# Patient Record
Sex: Female | Born: 1985 | Race: White | Hispanic: No | Marital: Single | Smoking: Current every day smoker
Health system: Southern US, Community
[De-identification: ages and names within clinical notes are randomized; demographics above are authoritative.]

## PROBLEM LIST (undated history)

## (undated) DIAGNOSIS — F32A Depression, unspecified: Secondary | ICD-10-CM

## (undated) DIAGNOSIS — F419 Anxiety disorder, unspecified: Secondary | ICD-10-CM

## (undated) DIAGNOSIS — F329 Major depressive disorder, single episode, unspecified: Secondary | ICD-10-CM

## (undated) HISTORY — DX: Anxiety disorder, unspecified: F41.9

## (undated) HISTORY — DX: Depression, unspecified: F32.A

## (undated) HISTORY — DX: Major depressive disorder, single episode, unspecified: F32.9

---

## 2006-01-02 ENCOUNTER — Emergency Department: Payer: Self-pay | Admitting: General Practice

## 2006-01-08 ENCOUNTER — Ambulatory Visit: Payer: Self-pay | Admitting: Orthopaedic Surgery

## 2007-10-17 ENCOUNTER — Ambulatory Visit: Payer: Self-pay | Admitting: Family Medicine

## 2016-02-02 ENCOUNTER — Encounter: Payer: Self-pay | Admitting: Family Medicine

## 2016-02-02 ENCOUNTER — Ambulatory Visit (INDEPENDENT_AMBULATORY_CARE_PROVIDER_SITE_OTHER): Payer: BLUE CROSS/BLUE SHIELD | Admitting: Family Medicine

## 2016-02-02 VITALS — BP 130/80 | HR 80 | Ht 64.0 in | Wt 190.0 lb

## 2016-02-02 DIAGNOSIS — L309 Dermatitis, unspecified: Secondary | ICD-10-CM | POA: Diagnosis not present

## 2016-02-02 DIAGNOSIS — Z7689 Persons encountering health services in other specified circumstances: Secondary | ICD-10-CM

## 2016-02-02 DIAGNOSIS — A6929 Other conditions associated with Lyme disease: Secondary | ICD-10-CM

## 2016-02-02 DIAGNOSIS — I5189 Other ill-defined heart diseases: Secondary | ICD-10-CM

## 2016-02-02 DIAGNOSIS — Z7189 Other specified counseling: Secondary | ICD-10-CM

## 2016-02-02 DIAGNOSIS — R569 Unspecified convulsions: Secondary | ICD-10-CM | POA: Diagnosis not present

## 2016-02-02 DIAGNOSIS — G40209 Localization-related (focal) (partial) symptomatic epilepsy and epileptic syndromes with complex partial seizures, not intractable, without status epilepticus: Secondary | ICD-10-CM

## 2016-02-02 DIAGNOSIS — G40109 Localization-related (focal) (partial) symptomatic epilepsy and epileptic syndromes with simple partial seizures, not intractable, without status epilepticus: Secondary | ICD-10-CM

## 2016-02-02 MED ORDER — TRIAMCINOLONE ACETONIDE 0.1 % EX CREA: 1.0000 "application " | TOPICAL_CREAM | Freq: Two times a day (BID) | CUTANEOUS | 0 refills | Status: DC

## 2016-02-02 NOTE — Progress Notes (Signed)
Name: Charlene Marquez   MRN: [id number]    DOB: June 27, 1986   Date:02/02/2016       Progress Note  Subjective  Chief Complaint  Chief Complaint  Patient presents with  . Establish Care  . Seizures    needs referral to [facility] for seizure disorder- neurologist left, so needs a new referral                  Hx of third degree block/ lyme carditis/    Seizures   This is a chronic problem. The current episode started more than 1 week ago (2 weeks ago). There were 2 to 3 seizures. The most recent episode lasted more than 5 minutes. Pertinent negatives include no headaches, no sore throat, no chest pain, no cough, no nausea and no diarrhea. olfactory aura/ repitious word/ LOC The episode was not witnessed. There was the sensation of an aura present. There has been no fever.  Rash  This is a new problem. The current episode started more than 1 month ago. The problem has been waxing and waning since onset. The affected locations include the left hand and right hand. The rash is characterized by blistering, dryness and itchiness. Associated with: multiple meds. Pertinent negatives include no cough, diarrhea, fever, joint pain, shortness of breath or sore throat. Past treatments include nothing.     No problem-specific Assessment & Plan notes found for this encounter.   No past medical history on file.  No past surgical history on file.  No family history on file.  Social History   Social History  . Marital status: Single    Spouse name: N/A  . Number of children: N/A  . Years of education: N/A   Occupational History  . Not on file.   Social History Main Topics  . Smoking status: Current Every Day Smoker    Types: Cigarettes  . Smokeless tobacco: Never Used  . Alcohol use Yes  . Drug use: No  . Sexual activity: Yes   Other Topics Concern  . Not on file   Social History Narrative  . No narrative on file    Allergies  Allergen Reactions  . Rocephin  [Ceftriaxone]      Review of Systems  Unable to perform ROS: Other (amenorrhea)  Constitutional: Negative for chills, fever, malaise/fatigue and weight loss.  HENT: Negative for ear discharge, ear pain and sore throat.   Eyes: Negative for blurred vision.  Respiratory: Negative for cough, sputum production, shortness of breath and wheezing.   Cardiovascular: Negative for chest pain, palpitations and leg swelling.  Gastrointestinal: Negative for abdominal pain, blood in stool, constipation, diarrhea, heartburn, melena and nausea.  Genitourinary: Negative for dysuria, frequency, hematuria and urgency.  Musculoskeletal: Negative for back pain, joint pain, myalgias and neck pain.  Skin: Positive for rash.  Neurological: Positive for seizures. Negative for dizziness, tingling, sensory change, focal weakness and headaches.  Endo/Heme/Allergies: Negative for environmental allergies and polydipsia. Does not bruise/bleed easily.  Psychiatric/Behavioral: Positive for depression. Negative for suicidal ideas. The patient is nervous/anxious.      Objective  Vitals:   02/02/16 1048  BP: 130/80  Pulse: 80  Weight: 190 lb (86.2 kg)  Height: 5\' 4"  (1.626 m)    Physical Exam  Constitutional: She is well-developed, well-nourished, and in no distress. No distress.  HENT:  Head: Normocephalic and atraumatic.  Right Ear: External ear normal.  Left Ear: External ear normal.  Nose: Nose normal.  Mouth/Throat: Oropharynx is  clear and moist.  Eyes: Conjunctivae and EOM are normal. Pupils are equal, round, and reactive to light. Right eye exhibits no discharge. Left eye exhibits no discharge.  Neck: Normal range of motion. Neck supple. No JVD present. No thyromegaly present.  Cardiovascular: Normal rate, regular rhythm, normal heart sounds and intact distal pulses.  Exam reveals no gallop and no friction rub.   No murmur heard. Pulmonary/Chest: Effort normal and breath sounds normal.  Abdominal:  Soft. Bowel sounds are normal. She exhibits no mass. There is no tenderness. There is no guarding.  Musculoskeletal: Normal range of motion. She exhibits no edema.  Lymphadenopathy:    She has no cervical adenopathy.  Neurological: She is alert. She has normal reflexes.  Skin: Skin is warm and dry. She is not diaphoretic.  Psychiatric: Mood and affect normal.  Nursing note and vitals reviewed.     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 Elizabeth Sauer [facility]  02/02/16

## 2017-09-29 ENCOUNTER — Other Ambulatory Visit
Admission: RE | Admit: 2017-09-29 | Discharge: 2017-09-29 | Disposition: A | Discharge status: Home / Self Care | Payer: BLUE CROSS/BLUE SHIELD | Source: Ambulatory Visit | Attending: Family Medicine | Admitting: Family Medicine

## 2017-09-29 ENCOUNTER — Encounter: Payer: Self-pay | Admitting: Family Medicine

## 2017-09-29 ENCOUNTER — Ambulatory Visit: Payer: BLUE CROSS/BLUE SHIELD | Admitting: Family Medicine

## 2017-09-29 ENCOUNTER — Ambulatory Visit
Admission: RE | Admit: 2017-09-29 | Discharge: 2017-09-29 | Disposition: A | Discharge status: Home / Self Care | Payer: BLUE CROSS/BLUE SHIELD | Source: Ambulatory Visit | Attending: Family Medicine | Admitting: Family Medicine

## 2017-09-29 VITALS — BP 120/100 | HR 100 | Temp 98.0°F | Resp 14 | Ht 64.0 in | Wt 211.0 lb

## 2017-09-29 DIAGNOSIS — J0100 Acute maxillary sinusitis, unspecified: Secondary | ICD-10-CM | POA: Diagnosis not present

## 2017-09-29 DIAGNOSIS — J181 Lobar pneumonia, unspecified organism: Secondary | ICD-10-CM

## 2017-09-29 DIAGNOSIS — R509 Fever, unspecified: Secondary | ICD-10-CM

## 2017-09-29 DIAGNOSIS — Z8701 Personal history of pneumonia (recurrent): Secondary | ICD-10-CM | POA: Insufficient documentation

## 2017-09-29 DIAGNOSIS — R0989 Other specified symptoms and signs involving the circulatory and respiratory systems: Secondary | ICD-10-CM | POA: Diagnosis not present

## 2017-09-29 DIAGNOSIS — R05 Cough: Secondary | ICD-10-CM | POA: Diagnosis present

## 2017-09-29 DIAGNOSIS — J189 Pneumonia, unspecified organism: Secondary | ICD-10-CM

## 2017-09-29 LAB — CBC WITH DIFFERENTIAL/PLATELET
BASOS PCT: 1 %
Basophils Absolute: 0.1 10*3/uL (ref 0–0.1)
EOS ABS: 0.6 10*3/uL (ref 0–0.7)
Eosinophils Relative: 7 %
HCT: 45 % (ref 35.0–47.0)
HEMOGLOBIN: 15.4 g/dL (ref 12.0–16.0)
Lymphocytes Relative: 39 %
Lymphs Abs: 3.7 10*3/uL — ABNORMAL HIGH (ref 1.0–3.6)
MCH: 29.6 pg (ref 26.0–34.0)
MCHC: 34.1 g/dL (ref 32.0–36.0)
MCV: 86.6 fL (ref 80.0–100.0)
MONOS PCT: 8 %
Monocytes Absolute: 0.7 10*3/uL (ref 0.2–0.9)
NEUTROS PCT: 45 %
Neutro Abs: 4.4 10*3/uL (ref 1.4–6.5)
Platelets: 294 10*3/uL (ref 150–440)
RBC: 5.19 MIL/uL (ref 3.80–5.20)
RDW: 12.7 % (ref 11.5–14.5)
WBC: 9.6 10*3/uL (ref 3.6–11.0)

## 2017-09-29 LAB — POCT INFLUENZA A/B
INFLUENZA A, POC: NEGATIVE
INFLUENZA B, POC: NEGATIVE

## 2017-09-29 MED ORDER — AZITHROMYCIN 250 MG PO TABS
ORAL_TABLET | ORAL | 0 refills | Status: DC
Start: 2017-09-29 — End: 2017-10-07

## 2017-09-29 NOTE — Addendum Note (Signed)
Addended by: Elizabeth SauerJONES, Cinda Hara C on: 09/29/2017 05:00 PM   Modules accepted: Level of Service

## 2017-09-29 NOTE — Progress Notes (Signed)
Name: Charlene Marquez   MRN: [id number]    DOB: 08-Feb-1986   Date:09/29/2017       Progress Note  Subjective  Chief Complaint  Chief Complaint  Patient presents with  . Fever    taking tylenol cold and flu for fever and chills, cong, alternating with dayquil- started 4 days ago    Fever   This is a new problem. The current episode started in the past 7 days (Thursday). The problem occurs constantly. The problem has been gradually worsening. The maximum temperature noted was 100 to 100.9 F. The temperature was taken using an oral thermometer. Associated symptoms include congestion, coughing, headaches, nausea, sleepiness, a sore throat, vomiting and wheezing. Pertinent negatives include no abdominal pain, chest pain, diarrhea, ear pain, muscle aches, rash or urinary pain. She has tried acetaminophen for the symptoms. The treatment provided no relief.  Risk factors: immunosuppression   Risk factors: no sick contacts   Risk factors comment:  Lyme   No problem-specific Assessment & Plan notes found for this encounter.   Past Medical History:  Diagnosis Date  . Anxiety   . Depression     History reviewed. No pertinent surgical history.  History reviewed. No pertinent family history.  Social History   Socioeconomic History  . Marital status: Single    Spouse name: Not on file  . Number of children: Not on file  . Years of education: Not on file  . Highest education level: Not on file  Occupational History  . Not on file  Social Needs  . Financial resource strain: Not on file  . Food insecurity:    Worry: Not on file    Inability: Not on file  . Transportation needs:    Medical: Not on file    Non-medical: Not on file  Tobacco Use  . Smoking status: Current Every Day Smoker    Types: Cigarettes  . Smokeless tobacco: Never Used  Substance and Sexual Activity  . Alcohol use: Yes  . Drug use: No  . Sexual activity: Yes  Lifestyle  . Physical activity:    Days per week: Not  on file    Minutes per session: Not on file  . Stress: Not on file  Relationships  . Social connections:    Talks on phone: Not on file    Gets together: Not on file    Attends religious service: Not on file    Active member of club or organization: Not on file    Attends meetings of clubs or organizations: Not on file    Relationship status: Not on file  . Intimate partner violence:    Fear of current or ex partner: Not on file    Emotionally abused: Not on file    Physically abused: Not on file    Forced sexual activity: Not on file  Other Topics Concern  . Not on file  Social History Narrative  . Not on file    Allergies  Allergen Reactions  . Rocephin [Ceftriaxone]     Outpatient Medications Prior to Visit  Medication Sig Dispense Refill  . amphetamine-dextroamphetamine (ADDERALL) 20 MG tablet Take 1 tablet by mouth 2 (two) times daily. psych  0  . amphetamine-dextroamphetamine (ADDERALL) 30 MG tablet Take 30 mg by mouth daily. Dr Lloyd Huger (psych)     . ARIPiprazole (ABILIFY) 10 MG tablet Take 10 mg by mouth daily. psych    . clonazePAM (KLONOPIN) 1 MG tablet Take 1 mg by mouth 2 (  two) times daily. Dr Lloyd Huger (psych)    . lamoTRIgine (LAMICTAL) 200 MG tablet Take 200 mg by mouth daily. 1 and 1/2 pills per day= 300mg  daily- Dr Lloyd Huger (psych)    . prazosin (MINIPRESS) 5 MG capsule Take 5 mg by mouth at bedtime. Dr Lloyd Huger (psych)    . triamcinolone cream (KENALOG) 0.1 % Apply 1 application topically 2 (two) times daily. 60 g 0  . lithium 300 MG tablet Take 900 mg by mouth daily. Dr Lloyd Huger (psych)     No facility-administered medications prior to visit.     Review of Systems  Constitutional: Positive for fever. Negative for chills, malaise/fatigue and weight loss.  HENT: Positive for congestion and sore throat. Negative for ear discharge and ear pain.   Eyes: Negative for blurred vision.  Respiratory: Positive for cough and wheezing. Negative for sputum production and shortness of  breath.   Cardiovascular: Negative for chest pain, palpitations and leg swelling.  Gastrointestinal: Positive for nausea and vomiting. Negative for abdominal pain, blood in stool, constipation, diarrhea, heartburn and melena.  Genitourinary: Negative for dysuria, frequency, hematuria and urgency.  Musculoskeletal: Negative for back pain, joint pain, myalgias and neck pain.  Skin: Negative for rash.  Neurological: Positive for headaches. Negative for dizziness, tingling, sensory change and focal weakness.  Endo/Heme/Allergies: Negative for environmental allergies and polydipsia. Does not bruise/bleed easily.  Psychiatric/Behavioral: Negative for depression and suicidal ideas. The patient is not nervous/anxious and does not have insomnia.      Objective  Vitals:   09/29/17 0855  BP: (!) 120/100  Pulse: 100  Resp: 14  Temp: 98 F (36.7 C)  TempSrc: Oral  SpO2: 97%  Weight: 211 lb (95.7 kg)  Height: 5\' 4"  (1.626 m)    Physical Exam  Constitutional: She is well-developed, well-nourished, and in no distress. No distress.  HENT:  Head: Normocephalic and atraumatic.  Right Ear: Tympanic membrane, external ear and ear canal normal.  Left Ear: Tympanic membrane, external ear and ear canal normal.  Nose: Mucosal edema present. Right sinus exhibits maxillary sinus tenderness. Right sinus exhibits no frontal sinus tenderness. Left sinus exhibits maxillary sinus tenderness. Left sinus exhibits no frontal sinus tenderness.  Mouth/Throat: Uvula is midline, oropharynx is clear and moist and mucous membranes are normal.  Eyes: Pupils are equal, round, and reactive to light. Conjunctivae and EOM are normal. Right eye exhibits no discharge. Left eye exhibits no discharge.  Neck: Normal range of motion. Neck supple. No JVD present. No thyromegaly present.  Cardiovascular: Normal rate, regular rhythm, normal heart sounds and intact distal pulses. Exam reveals no gallop and no friction rub.  No murmur  heard. Pulmonary/Chest: Effort normal. She has decreased breath sounds in the right middle field. She has no wheezes. She has no rales. Chest wall is dull to percussion.  Abdominal: Soft. Bowel sounds are normal. She exhibits no mass. There is no tenderness. There is no rebound and no guarding.  Musculoskeletal: Normal range of motion. She exhibits no edema.  Lymphadenopathy:    She has no cervical adenopathy.  Neurological: She is alert. She has normal reflexes.  Skin: Skin is warm and dry. She is not diaphoretic.  Psychiatric: Mood and affect normal.  Nursing note and vitals reviewed.     Assessment & Plan  Problem List Items Addressed This Visit    None    Visit Diagnoses    Fever and chills    -  Primary   Relevant Orders   POCT Influenza  A/B (Completed)   DG Chest 2 View (Completed)   Pneumonia of right middle lobe due to infectious organism [facility](HCC)       cbc/downstairs lab   Relevant Medications   azithromycin (ZITHROMAX) 250 MG tablet   Other Relevant Orders   DG Chest 2 View (Completed)   Acute maxillary sinusitis, recurrence not specified       Relevant Medications   azithromycin (ZITHROMAX) 250 MG tablet      Meds ordered this encounter  Medications  . azithromycin (ZITHROMAX) 250 MG tablet    Sig: 2 today then 1 a day for 4 days    Dispense:  6 tablet    Refill:  0      Dr. Hayden Rasmusseneanna Jones [facility]  09/29/17

## 2017-10-07 ENCOUNTER — Ambulatory Visit: Payer: BLUE CROSS/BLUE SHIELD | Admitting: Family Medicine

## 2017-10-07 ENCOUNTER — Encounter: Payer: Self-pay | Admitting: Family Medicine

## 2017-10-07 ENCOUNTER — Ambulatory Visit (INDEPENDENT_AMBULATORY_CARE_PROVIDER_SITE_OTHER): Payer: BLUE CROSS/BLUE SHIELD | Admitting: Family Medicine

## 2017-10-07 VITALS — BP 120/80 | HR 88 | Ht 64.0 in | Wt 214.0 lb

## 2017-10-07 DIAGNOSIS — G40109 Localization-related (focal) (partial) symptomatic epilepsy and epileptic syndromes with simple partial seizures, not intractable, without status epilepticus: Secondary | ICD-10-CM

## 2017-10-07 DIAGNOSIS — R569 Unspecified convulsions: Secondary | ICD-10-CM | POA: Diagnosis not present

## 2017-10-07 DIAGNOSIS — J0101 Acute recurrent maxillary sinusitis: Secondary | ICD-10-CM | POA: Diagnosis not present

## 2017-10-07 DIAGNOSIS — W57XXXA Bitten or stung by nonvenomous insect and other nonvenomous arthropods, initial encounter: Secondary | ICD-10-CM

## 2017-10-07 DIAGNOSIS — G40209 Localization-related (focal) (partial) symptomatic epilepsy and epileptic syndromes with complex partial seizures, not intractable, without status epilepticus: Secondary | ICD-10-CM | POA: Insufficient documentation

## 2017-10-07 MED ORDER — DOXYCYCLINE HYCLATE 100 MG PO TABS: 100.0000 mg | ORAL_TABLET | Freq: Two times a day (BID) | ORAL | 0 refills | Status: DC
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
## 2018-09-09 ENCOUNTER — Ambulatory Visit
Admission: RE | Admit: 2018-09-09 | Discharge: 2018-09-09 | Disposition: A | Discharge status: Home / Self Care | Payer: BLUE CROSS/BLUE SHIELD | Attending: Family Medicine | Admitting: Family Medicine

## 2018-09-09 ENCOUNTER — Other Ambulatory Visit: Payer: Self-pay

## 2018-09-09 ENCOUNTER — Ambulatory Visit (INDEPENDENT_AMBULATORY_CARE_PROVIDER_SITE_OTHER): Payer: BLUE CROSS/BLUE SHIELD | Admitting: Family Medicine

## 2018-09-09 ENCOUNTER — Encounter: Payer: Self-pay | Admitting: Family Medicine

## 2018-09-09 ENCOUNTER — Ambulatory Visit
Admission: RE | Admit: 2018-09-09 | Discharge: 2018-09-09 | Disposition: A | Discharge status: Home / Self Care | Payer: BLUE CROSS/BLUE SHIELD | Source: Ambulatory Visit | Attending: Family Medicine | Admitting: Family Medicine

## 2018-09-09 VITALS — BP 120/70 | HR 68 | Temp 98.6°F | Ht 64.0 in | Wt 211.0 lb

## 2018-09-09 DIAGNOSIS — M7741 Metatarsalgia, right foot: Secondary | ICD-10-CM | POA: Diagnosis not present

## 2018-09-09 DIAGNOSIS — J0100 Acute maxillary sinusitis, unspecified: Secondary | ICD-10-CM

## 2018-09-09 MED ORDER — AZITHROMYCIN 250 MG PO TABS: ORAL_TABLET | ORAL | 0 refills | Status: AC

## 2018-09-09 NOTE — Progress Notes (Signed)
Date:  09/09/2018   Name:  Charlene Marquez   DOB:  May 14, 1986   MRN:  [id number]   Chief Complaint: Sinusitis (stuffy nose, cough, body aches, cong , facial pressure) and Foot Pain (outer part of R) foot. missed a step and hit foot )  Sinusitis  This is a new problem. The current episode started 1 to 4 weeks ago (Tuesday last week). The problem has been gradually improving since onset. There has been no fever. The pain is mild. Associated symptoms include congestion, coughing, diaphoresis, headaches and sinus pressure. Pertinent negatives include no chills, ear pain, hoarse voice, neck pain, shortness of breath, sneezing, sore throat or swollen glands. Past treatments include oral decongestants and spray decongestants (alka seltzer/mucinex dm). The treatment provided mild relief.  Foot Pain  This is a new problem. The current episode started in the past 7 days (Friday last week). The problem occurs constantly. The problem has been unchanged. Associated symptoms include arthralgias, congestion, coughing, diaphoresis, fatigue and headaches. Pertinent negatives include no abdominal pain, chills, fever, myalgias, nausea, neck pain, numbness, rash, sore throat, swollen glands, vomiting or weakness. Associated symptoms comments: Pain along 5th metatarsal.    Review of Systems  Constitutional: Positive for diaphoresis and fatigue. Negative for chills, fever and unexpected weight change.  HENT: Positive for congestion and sinus pressure. Negative for ear discharge, ear pain, hoarse voice, rhinorrhea, sneezing and sore throat.   Eyes: Negative for photophobia, pain, discharge, redness and itching.  Respiratory: Positive for cough. Negative for shortness of breath, wheezing and stridor.   Gastrointestinal: Negative for abdominal pain, blood in stool, constipation, diarrhea, nausea and vomiting.  Endocrine: Negative for cold intolerance, heat intolerance, polydipsia, polyphagia and polyuria.  Genitourinary:  Negative for dysuria, flank pain, frequency, hematuria, menstrual problem, pelvic pain, urgency, vaginal bleeding and vaginal discharge.  Musculoskeletal: Positive for arthralgias. Negative for back pain, myalgias and neck pain.  Skin: Negative for rash.  Allergic/Immunologic: Negative for environmental allergies and food allergies.  Neurological: Positive for headaches. Negative for dizziness, weakness, light-headedness and numbness.  Hematological: Negative for adenopathy. Does not bruise/bleed easily.  Psychiatric/Behavioral: Negative for dysphoric mood. The patient is not nervous/anxious.     Patient Active Problem List   Diagnosis Date Noted  . 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 Rocephin [Ceftriaxone]     No past surgical history on file.  Social History   Tobacco Use  . Smoking status: Current Every Day Smoker    Types: Cigarettes  . Smokeless tobacco: Never Used  Substance Use Topics  . Alcohol use: Yes  . Drug use: No     Medication list has been reviewed and updated.  Current Meds  Medication Sig  . amphetamine-dextroamphetamine (ADDERALL) 20 MG tablet Take 20 mg by mouth daily. Dr Lloyd Huger (psych)   . amphetamine-dextroamphetamine (ADDERALL) 20 MG tablet Take 1 tablet by mouth 2 (two) times daily. psych  . ARIPiprazole (ABILIFY) 30 MG tablet Take 30 mg by mouth daily. psych   . clonazePAM (KLONOPIN) 1 MG tablet Take 1 mg by mouth 2 (two) times daily. Dr Lloyd Huger (psych)  . lamoTRIgine (LAMICTAL) 200 MG tablet Take 200 mg by mouth daily. 1 and 1/2 pills per day= 300mg  daily- Dr Lloyd Huger (psych)  . lithium 300 MG tablet Take 1,200 mg by mouth. psych  . prazosin (MINIPRESS) 5 MG capsule Take 5 mg by mouth at bedtime. Dr Lloyd Huger (psych)  PHQ 2/9 Scores 09/29/2017 09/29/2017  PHQ - 2 Score 0 0  PHQ- 9 Score 0 -    Physical Exam Vitals signs and nursing note reviewed.  Constitutional:       General: She is not in acute distress.    Appearance: She is not diaphoretic.  HENT:     Head: Normocephalic and atraumatic.     Jaw: There is normal jaw occlusion.     Right Ear: Hearing, tympanic membrane, ear canal and external ear normal.     Left Ear: Hearing, tympanic membrane, ear canal and external ear normal.     Nose:     Right Turbinates: Swollen.     Left Turbinates: Swollen.     Right Sinus: Maxillary sinus tenderness present. No frontal sinus tenderness.     Left Sinus: Maxillary sinus tenderness present. No frontal sinus tenderness.     Mouth/Throat:     Lips: Pink.     Mouth: Mucous membranes are moist.     Pharynx: Oropharynx is clear. Uvula midline.     Tonsils: No tonsillar exudate.  Eyes:     General:        Right eye: No discharge.        Left eye: No discharge.     Conjunctiva/sclera: Conjunctivae normal.     Pupils: Pupils are equal, round, and reactive to light.  Neck:     Musculoskeletal: Normal range of motion and neck supple.     Thyroid: No thyroid mass, thyromegaly or thyroid tenderness.     Vascular: No JVD.  Cardiovascular:     Rate and Rhythm: Normal rate and regular rhythm.     Heart sounds: Normal heart sounds, S1 normal and S2 normal. No murmur. No friction rub. No gallop. No S3 or S4 sounds.   Pulmonary:     Effort: Pulmonary effort is normal.     Breath sounds: Normal breath sounds. No decreased breath sounds, wheezing, rhonchi or rales.  Abdominal:     General: Bowel sounds are normal.     Palpations: Abdomen is soft. There is no mass.     Tenderness: There is no abdominal tenderness. There is no guarding.  Musculoskeletal: Normal range of motion.     Right foot: Normal range of motion. Tenderness present. No bony tenderness or swelling.       Feet:  Lymphadenopathy:     Cervical: No cervical adenopathy.  Skin:    General: Skin is warm and dry.  Neurological:     Mental Status: She is alert.     Deep Tendon Reflexes: Reflexes are  normal and symmetric.     Wt Readings from Last 3 Encounters:  09/09/18 211 lb (95.7 kg)  10/07/17 214 lb (97.1 kg)  09/29/17 211 lb (95.7 kg)    BP 120/70   Pulse 68   Temp 98.6 F (37 C) (Oral)   Ht 5\' 4"  (1.626 m)   Wt 211 lb (95.7 kg)   BMI 36.22 kg/m   Assessment and Plan:

## 2018-09-11 ENCOUNTER — Encounter: Payer: Self-pay | Admitting: Family Medicine

## 2020-06-29 IMAGING — CR RIGHT FOOT COMPLETE - 3+ VIEW
4 series · 4 of 4 positions shown · non-contrast
Comparison: None.

CLINICAL DATA: Pain and tenderness in the right fifth metatarsal
after running up steps 5-6 days ago.

EXAM:
RIGHT FOOT COMPLETE - 3+ VIEW

[foot ap]
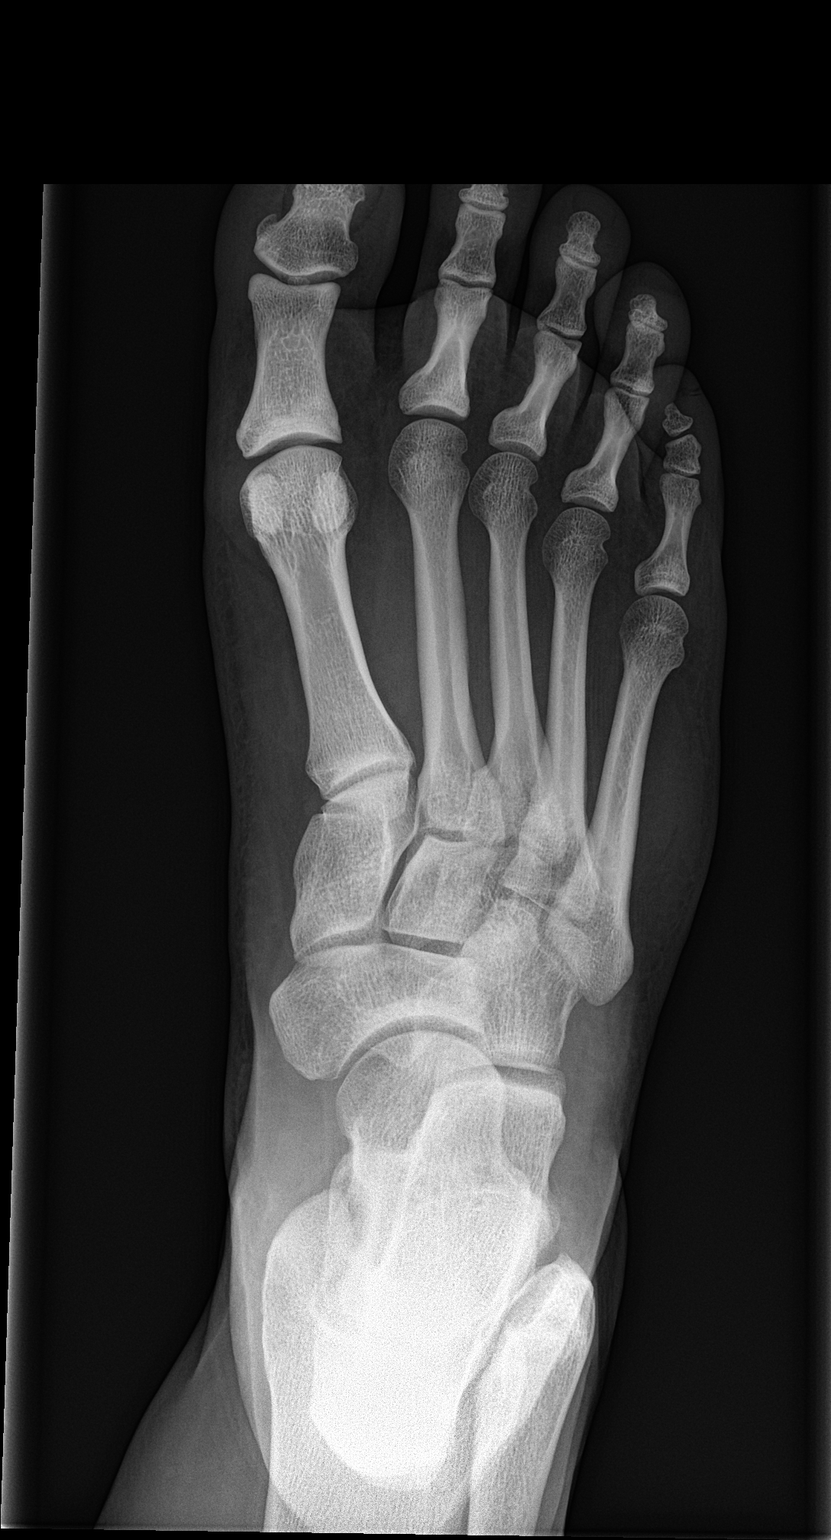

[foot obl]
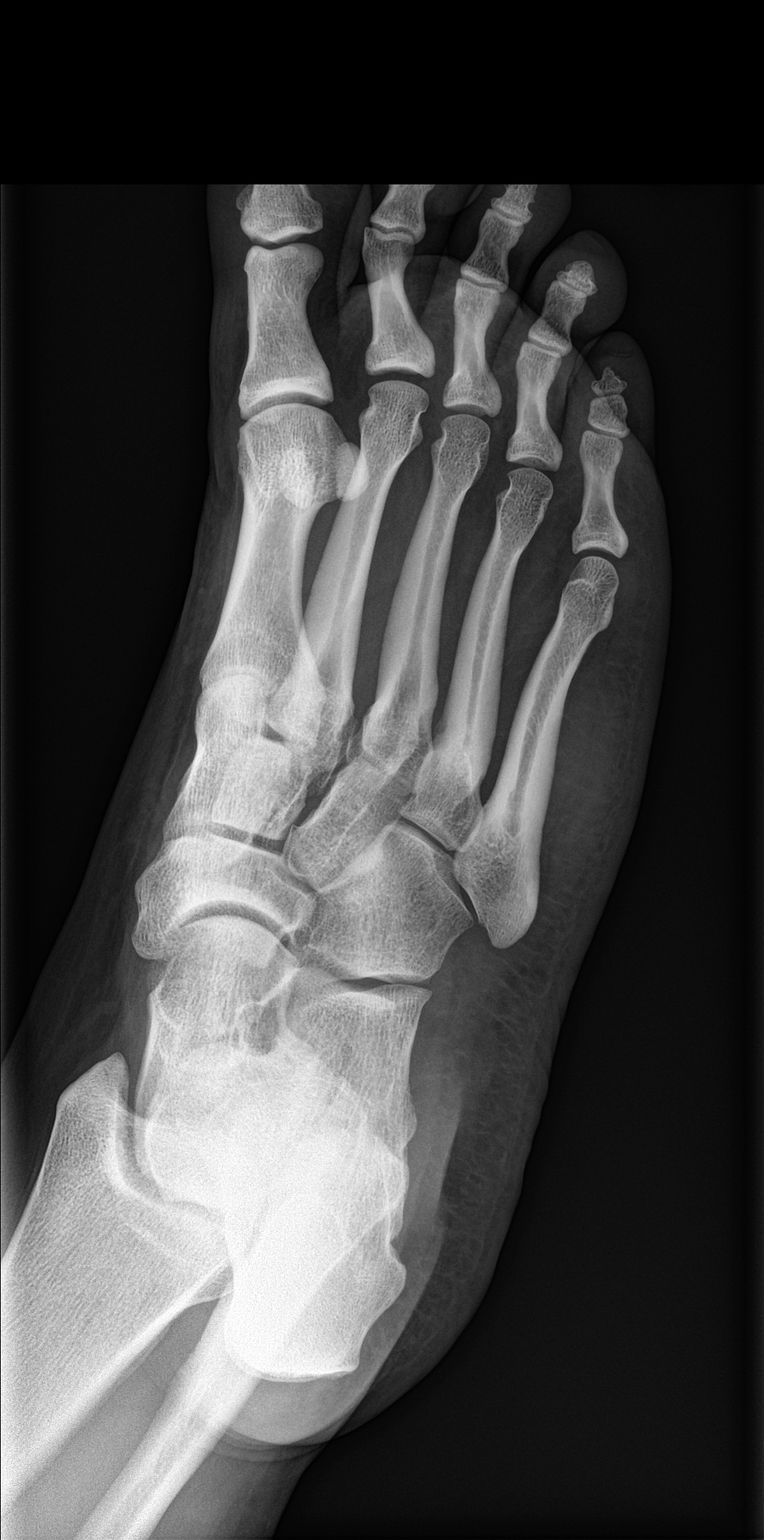

[foot lat (1 of 2)]
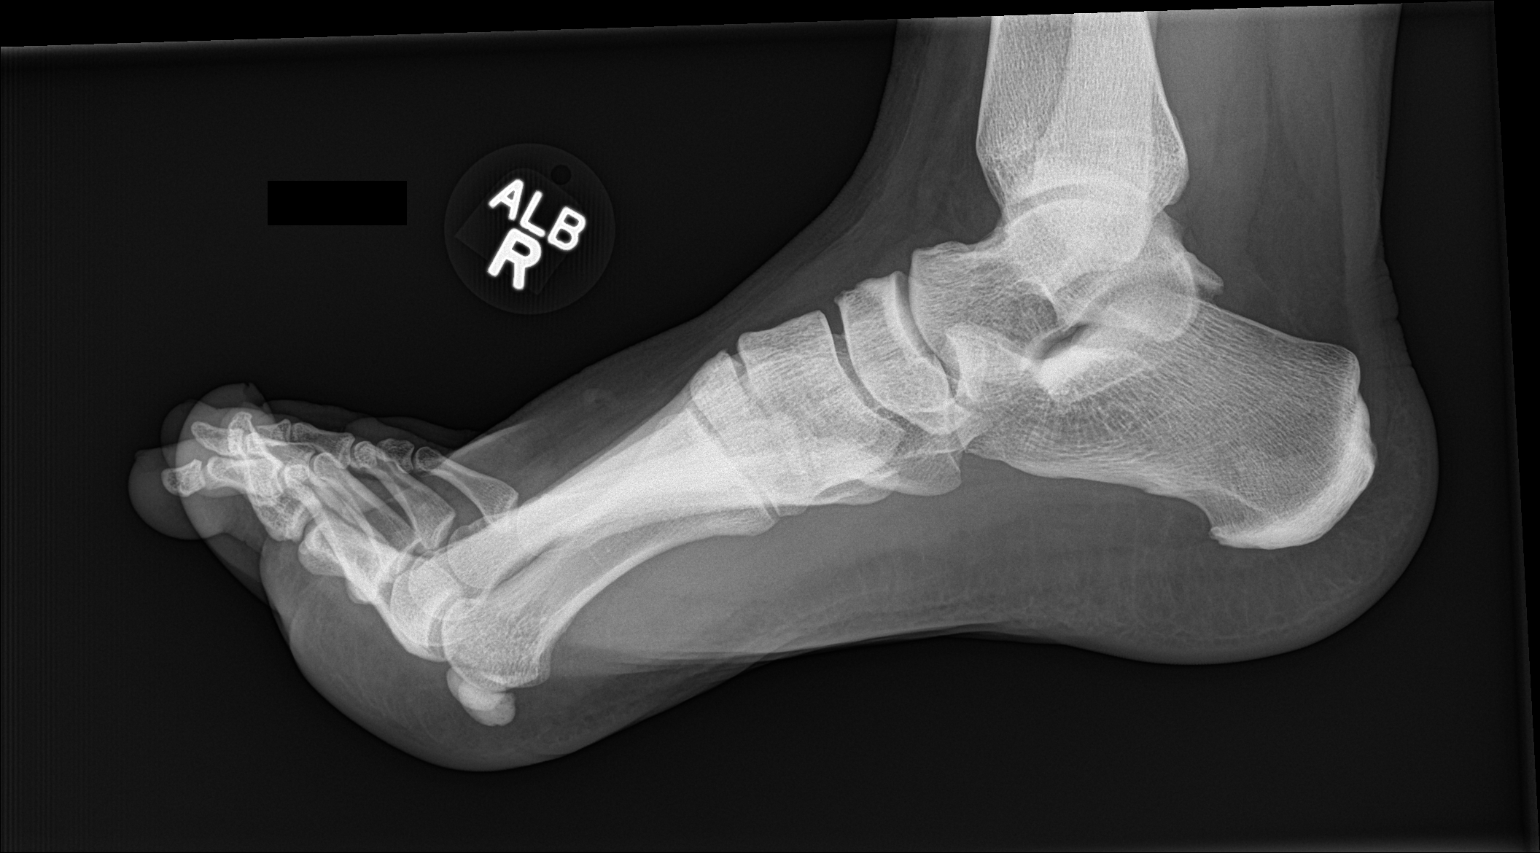

[foot lat (2 of 2)]
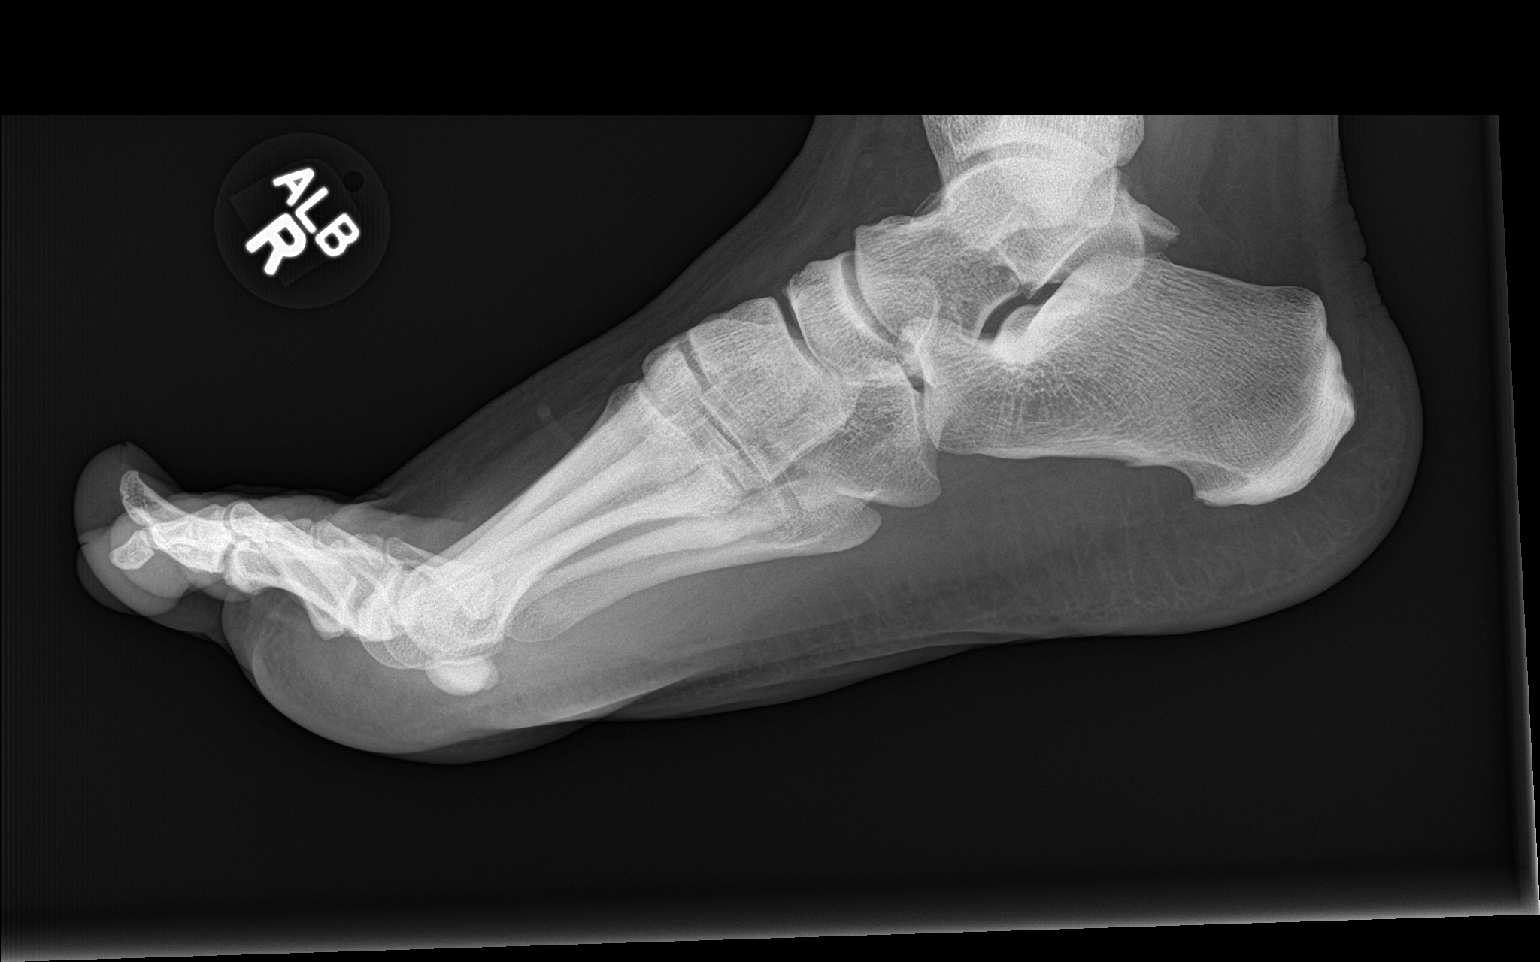

[4 of 4 positions shown; findings below may reference images not displayed]

FINDINGS: The joint spaces are normal. No acute bony abnormality. Mild Holinka
Recay.
IMPRESSION: No acute bony findings or degenerative changes. Specifically, I do
not see a fracture of the fifth metatarsal.
